# Patient Record
Sex: Female | Born: 2000 | Race: Black or African American | Hispanic: No | Marital: Single | Smoking: Never smoker
Health system: Southern US, Community
[De-identification: ages and names within clinical notes are randomized; demographics above are authoritative.]

---
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
## 2018-02-19 ENCOUNTER — Encounter (HOSPITAL_COMMUNITY): Payer: Self-pay | Admitting: Emergency Medicine

## 2018-02-19 ENCOUNTER — Emergency Department (HOSPITAL_COMMUNITY)
Admission: EM | Admit: 2018-02-19 | Discharge: 2018-02-19 | Disposition: A | Discharge status: Home / Self Care | Payer: Medicaid Other | Attending: Emergency Medicine | Admitting: Emergency Medicine

## 2018-02-19 DIAGNOSIS — R350 Frequency of micturition: Secondary | ICD-10-CM | POA: Insufficient documentation

## 2018-02-19 DIAGNOSIS — N898 Other specified noninflammatory disorders of vagina: Secondary | ICD-10-CM

## 2018-02-19 LAB — URINALYSIS, ROUTINE W REFLEX MICROSCOPIC
Bilirubin Urine: NEGATIVE
GLUCOSE, UA: NEGATIVE mg/dL
KETONES UR: NEGATIVE mg/dL
Nitrite: NEGATIVE
PROTEIN: NEGATIVE mg/dL
RBC / HPF: >50 RBC/hpf — ABNORMAL HIGH (ref 0–5)
Specific Gravity, Urine: 1.024 (ref 1.005–1.030)
pH: 5 (ref 5.0–8.0)

## 2018-02-19 LAB — WET PREP, GENITAL
Sperm: NONE SEEN
Trich, Wet Prep: NONE SEEN
Yeast Wet Prep HPF POC: NONE SEEN

## 2018-02-19 LAB — PREGNANCY, URINE: Preg Test, Ur: NEGATIVE

## 2018-02-19 MED ORDER — METRONIDAZOLE 500 MG PO TABS: 500.0000 mg | ORAL_TABLET | Freq: Two times a day (BID) | ORAL | 0 refills | Status: DC

## 2018-02-19 MED ORDER — CEFTRIAXONE SODIUM 250 MG IJ SOLR
250.0000 mg | Freq: Once | INTRAMUSCULAR | Status: AC
  Administered 2018-02-19: 250 mg via INTRAMUSCULAR
  Filled 2018-02-19: qty 250

## 2018-02-19 MED ORDER — DOXYCYCLINE HYCLATE 100 MG PO CAPS
100.0000 mg | ORAL_CAPSULE | Freq: Two times a day (BID) | ORAL | 0 refills | Status: DC
Start: 2018-02-19 — End: 2023-04-14

## 2018-02-19 MED ORDER — IBUPROFEN 800 MG PO TABS: 800.0000 mg | ORAL_TABLET | Freq: Three times a day (TID) | ORAL | 0 refills | Status: DC
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
## 2018-02-19 NOTE — ED Triage Notes (Signed)
Pt reports lower abd cramping and vaginal discharge that is white and sometimes has odor. Pt reports is told just BV and gets treated but feels couple be PID when Googled her s/s.

## 2018-02-19 NOTE — Discharge Instructions (Addendum)
You were seen in the ER for vaginal discharge, abdominal pain, and urinary symptoms. Your labs did not show signs of a UTI- we will call you if the culture returns with findings of UTI. We are waiting on your gonorrhea and chlamydia test results. As discussed we are covering your for PID with antibiotics: Doxycyline and Flagyl- take each of these antibiotics twice per day with food. Please take all of your antibiotics until finished. You may develop abdominal discomfort or diarrhea from the antibiotic.  You may help offset this with probiotics which you can buy at the store (ask your pharmacist if unable to find) or get probiotics in the form of eating yogurt. Do not eat or take the probiotics until 2 hours after your antibiotic. If you are unable to tolerate these side effects follow-up with your primary care provider or return to the emergency department.   If you begin to experience any blistering, rashes, swelling, or difficulty breathing seek medical care for evaluation of potentially more serious side effects.   Please be aware that this medication may interact with other medications you are taking, please be sure to discuss your medication list with your pharmacist. If you are taking birth control the antibiotic will deactivate your birth control for 2 weeks.   Additionally you may NOT drink alcohol when taking flagyl(metronidazole) as it can have serious life threatening consequences.   Abstain from intercourse of any kind until completion of your antibiotics.  We will call you if your gonorrhea/chlamydia results return positive, if positive you will need to inform all sexual partners that they may seek evaluation and treatment as necessary.  Follow-up with the women's health provider and your discharge instructions within 1 week.  Return to ER for new or worsening symptoms including but not limited to fever (temperature of 100.4 greater) inability to keep fluids down, worsening pain, or any other  concerns.  Additionally we have given you prescription for ibuprofen which may take for menstrual cramping.

## 2018-02-21 LAB — URINE CULTURE

## 2018-02-22 LAB — GC/CHLAMYDIA PROBE AMP (~~LOC~~) NOT AT ARMC
CHLAMYDIA, DNA PROBE: NEGATIVE
NEISSERIA GONORRHEA: NEGATIVE

## 2018-03-31 ENCOUNTER — Encounter

## 2018-10-11 ENCOUNTER — Emergency Department: Payer: No Typology Code available for payment source

## 2018-10-11 ENCOUNTER — Other Ambulatory Visit: Payer: Self-pay

## 2018-10-11 ENCOUNTER — Emergency Department
Admission: EM | Admit: 2018-10-11 | Discharge: 2018-10-11 | Disposition: A | Discharge status: Home / Self Care | Payer: No Typology Code available for payment source | Attending: Emergency Medicine | Admitting: Emergency Medicine

## 2018-10-11 DIAGNOSIS — Z79899 Other long term (current) drug therapy: Secondary | ICD-10-CM | POA: Diagnosis not present

## 2018-10-11 DIAGNOSIS — Y9389 Activity, other specified: Secondary | ICD-10-CM | POA: Diagnosis not present

## 2018-10-11 DIAGNOSIS — Y999 Unspecified external cause status: Secondary | ICD-10-CM | POA: Diagnosis not present

## 2018-10-11 DIAGNOSIS — R079 Chest pain, unspecified: Secondary | ICD-10-CM | POA: Insufficient documentation

## 2018-10-11 DIAGNOSIS — S3992XA Unspecified injury of lower back, initial encounter: Secondary | ICD-10-CM | POA: Diagnosis present

## 2018-10-11 DIAGNOSIS — Y92410 Unspecified street and highway as the place of occurrence of the external cause: Secondary | ICD-10-CM | POA: Diagnosis not present

## 2018-10-11 DIAGNOSIS — S8001XA Contusion of right knee, initial encounter: Secondary | ICD-10-CM | POA: Insufficient documentation

## 2018-10-11 DIAGNOSIS — S39012A Strain of muscle, fascia and tendon of lower back, initial encounter: Secondary | ICD-10-CM | POA: Insufficient documentation

## 2018-10-11 MED ORDER — IBUPROFEN 600 MG PO TABS
600.0000 mg | ORAL_TABLET | Freq: Once | ORAL | Status: AC
  Administered 2018-10-11: 600 mg via ORAL
  Filled 2018-10-11: qty 1

## 2018-10-11 MED ORDER — IBUPROFEN 600 MG PO TABS: 600.0000 mg | ORAL_TABLET | Freq: Four times a day (QID) | ORAL | 0 refills | Status: DC | PRN

## 2018-10-11 NOTE — ED Notes (Signed)
Pt in lobby, talking on phone in no acute distress.
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
## 2018-10-11 NOTE — ED Notes (Signed)
Pt is AOx4, VSS, she denies any pain. Pt is awaiting ride at this time.
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
## 2018-10-11 NOTE — ED Notes (Signed)
Warm blankets provided.

## 2018-10-11 NOTE — Discharge Instructions (Addendum)
It is normal for you to feel more sore later today or tomorrow.  Return to the ER for new or worsening pain, weakness, difficulty walking, or any other new or worsening symptoms that concern you.
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
## 2018-10-11 NOTE — ED Notes (Signed)
Pt to xray

## 2018-10-11 NOTE — ED Notes (Signed)
Pt is being discharged to home. Pt is AOx4, vss, she denies any pain at this time. AVS was given and explained to the patient and she verbalized understanding of all information. Pt is awaiting her ride at this time.
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
## 2021-05-16 ENCOUNTER — Ambulatory Visit: Payer: Self-pay

## 2021-05-17 ENCOUNTER — Ambulatory Visit
Admission: RE | Admit: 2021-05-17 | Discharge: 2021-05-17 | Disposition: A | Discharge status: Home / Self Care | Payer: BC Managed Care – PPO | Source: Ambulatory Visit | Attending: Emergency Medicine | Admitting: Emergency Medicine

## 2021-05-17 ENCOUNTER — Other Ambulatory Visit: Payer: Self-pay

## 2021-05-17 VITALS — BP 129/82 | HR 64 | Temp 99.1°F | Resp 18

## 2021-05-17 DIAGNOSIS — Z202 Contact with and (suspected) exposure to infections with a predominantly sexual mode of transmission: Secondary | ICD-10-CM | POA: Insufficient documentation

## 2021-05-17 LAB — POCT URINE PREGNANCY: Preg Test, Ur: NEGATIVE

## 2021-05-17 MED ORDER — CEFTRIAXONE SODIUM 500 MG IJ SOLR
500.0000 mg | Freq: Once | INTRAMUSCULAR | Status: AC
  Administered 2021-05-17: 500 mg via INTRAMUSCULAR

## 2021-05-17 NOTE — Discharge Instructions (Signed)
We gave you a shot of Rocephin to treat gonorrhea.  Please refrain from sexual intercourse over the next 2 weeks.  We are testing you for Gonorrhea, Chlamydia, Trichomonas, Yeast and Bacterial Vaginosis. We will call you if anything is positive and let you know if you require any further treatment. Please inform partners of any positive results.   Please return if symptoms not improving with treatment, development of fever, nausea, vomiting, abdominal pain.

## 2021-05-17 NOTE — ED Triage Notes (Signed)
Pt requesting to be tested for STDs d/t exposure (gonorrhea). Pt states she is having lower abd pain with discharge that started about 2-3 days ago.

## 2021-05-17 NOTE — ED Provider Notes (Signed)
[facility]    CSN: [id number] Arrival date & time: 05/17/21  1124      History   Chief Complaint Chief Complaint  Patient presents with   Appointment    1130   Exposure to STD    HPI Cassidy Brewer is a 20 y.o. female presenting today for STD screening after gonorrhea exposure.  Reports partner recently tested positive for gonorrhea.  Reports over the past 2 to 3 days has had lower abdominal discomfort along with vaginal discharge.  Recently with menstrual cycle earlier this month.  Denies fevers nausea or vomiting.  Denies urinary symptoms.  HPI  No past medical history on file.  There are no problems to display for this patient.   No past surgical history on file.  OB History   No obstetric history on file.      Home Medications    Prior to Admission medications   Medication Sig Start Date End Date Taking? Authorizing Provider  doxycycline (VIBRAMYCIN) 100 MG capsule Take 1 capsule (100 mg total) by mouth 2 (two) times daily. 02/19/18   Petrucelli, Pleas Koch, PA-C  etonogestrel (NEXPLANON) 68 MG IMPL implant Inject 68 mg into the skin daily.    [provider]  ibuprofen (ADVIL,MOTRIN) 600 MG tablet Take 1 tablet (600 mg total) by mouth every 6 (six) hours as needed. 10/11/18   Schaevitz, Myra Rude, MD  metroNIDAZOLE (FLAGYL) 500 MG tablet Take 1 tablet (500 mg total) by mouth 2 (two) times daily. 02/19/18   Petrucelli, Pleas Koch, PA-C    Family History No family history on file.  Social History Social History   Tobacco Use   Smoking status: Never   Smokeless tobacco: Never  Vaping Use   Vaping Use: Every day  Substance Use Topics   Alcohol use: Yes   Drug use: No     Allergies   Patient has no known allergies.   Review of Systems Review of Systems  Constitutional:  Negative for fever.  Respiratory:  Negative for shortness of breath.   Cardiovascular:  Negative for chest pain.  Gastrointestinal:  Positive for  abdominal pain. Negative for diarrhea, nausea and vomiting.  Genitourinary:  Positive for vaginal discharge. Negative for dysuria, flank pain, genital sores, hematuria, menstrual problem, vaginal bleeding and vaginal pain.  Musculoskeletal:  Negative for back pain.  Skin:  Negative for rash.  Neurological:  Negative for dizziness, light-headedness and headaches.    Physical Exam Triage Vital Signs ED Triage Vitals  Enc Vitals Group     BP 05/17/21 1202 129/82     Pulse Rate 05/17/21 1202 64     Resp 05/17/21 1202 18     Temp 05/17/21 1202 99.1 F (37.3 C)     Temp Source 05/17/21 1202 Oral     SpO2 05/17/21 1202 97 %     Weight --      Height --      Head Circumference --      Peak Flow --      Pain Score 05/17/21 1204 3     Pain Loc --      Pain Edu? --      Excl. in GC? --    No data found.  Updated Vital Signs BP 129/82 (BP Location: Right Arm)   Pulse 64   Temp 99.1 F (37.3 C) (Oral)   Resp 18   LMP 05/02/2021 (Approximate)   SpO2 97%   Visual Acuity Right Eye Distance:   Left  Eye Distance:   Bilateral Distance:    Right Eye Near:   Left Eye Near:    Bilateral Near:     Physical Exam Vitals and nursing note reviewed.  Constitutional:      Appearance: She is well-developed.     Comments: No acute distress  HENT:     Head: Normocephalic and atraumatic.     Nose: Nose normal.  Eyes:     Conjunctiva/sclera: Conjunctivae normal.  Cardiovascular:     Rate and Rhythm: Normal rate.  Pulmonary:     Effort: Pulmonary effort is normal. No respiratory distress.  Abdominal:     General: There is no distension.     Comments: Soft, nondistended, mild tenderness to palpation of bilateral lower quadrants, negative rebound, negative Rovsing, negative Burney's  Musculoskeletal:        General: Normal range of motion.     Cervical back: Neck supple.  Skin:    General: Skin is warm and dry.  Neurological:     Mental Status: She is alert and oriented to person,  place, and time.     UC Treatments / Results  Labs (all labs ordered are listed, but only abnormal results are displayed) Labs Reviewed  POCT URINE PREGNANCY  CERVICOVAGINAL ANCILLARY ONLY    EKG   Radiology No results found.  Procedures Procedures (including critical care time)  Medications Ordered in UC Medications  cefTRIAXone (ROCEPHIN) injection 500 mg (has no administration in time range)    Initial Impression / Assessment and Plan / UC Course  I have reviewed the triage vital signs and the nursing notes.  Pertinent labs & imaging results that were available during my care of the patient were reviewed by me and considered in my medical decision making (see chart for details).     STD screen/gonorrhea exposure-empirically treating with Rocephin to cover gonorrhea, vaginal swab pending to further screen for other STDs, will call with results and provide further treatment if needed.  Discussed strict return precautions. Patient verbalized understanding and is agreeable with plan.  Final Clinical Impressions(s) / UC Diagnoses   Final diagnoses:  STD exposure     Discharge Instructions      We gave you a shot of Rocephin to treat gonorrhea.  Please refrain from sexual intercourse over the next 2 weeks.  We are testing you for Gonorrhea, Chlamydia, Trichomonas, Yeast and Bacterial Vaginosis. We will call you if anything is positive and let you know if you require any further treatment. Please inform partners of any positive results.   Please return if symptoms not improving with treatment, development of fever, nausea, vomiting, abdominal pain.      ED Prescriptions   None    PDMP not reviewed this encounter.   Lew Dawes, PA-C 05/17/21 1240

## 2021-05-20 LAB — CERVICOVAGINAL ANCILLARY ONLY
Bacterial Vaginitis (gardnerella): POSITIVE — AB
Candida Glabrata: NEGATIVE
Candida Vaginitis: NEGATIVE
Chlamydia: NEGATIVE
Comment: NEGATIVE
Comment: NEGATIVE
Comment: NEGATIVE
Comment: NEGATIVE
Comment: NEGATIVE
Comment: NORMAL
Neisseria Gonorrhea: POSITIVE — AB
Trichomonas: NEGATIVE

## 2021-05-21 ENCOUNTER — Telehealth (HOSPITAL_COMMUNITY): Payer: Self-pay | Admitting: Emergency Medicine

## 2021-05-21 MED ORDER — METRONIDAZOLE 500 MG PO TABS: 500.0000 mg | ORAL_TABLET | Freq: Two times a day (BID) | ORAL | 0 refills | Status: DC

## 2023-03-12 ENCOUNTER — Other Ambulatory Visit: Payer: Self-pay

## 2023-03-12 ENCOUNTER — Emergency Department (HOSPITAL_COMMUNITY)
Admission: EM | Admit: 2023-03-12 | Discharge: 2023-03-12 | Disposition: A | Discharge status: Home / Self Care | Payer: Medicaid Other | Attending: Emergency Medicine | Admitting: Emergency Medicine

## 2023-03-12 ENCOUNTER — Encounter (HOSPITAL_COMMUNITY): Payer: Self-pay | Admitting: Emergency Medicine

## 2023-03-12 DIAGNOSIS — L0501 Pilonidal cyst with abscess: Secondary | ICD-10-CM | POA: Diagnosis present

## 2023-03-12 MED ORDER — LIDOCAINE-EPINEPHRINE-TETRACAINE (LET) TOPICAL GEL
3.0000 mL | Freq: Once | TOPICAL | Status: AC
  Administered 2023-03-12: 3 mL via TOPICAL
  Filled 2023-03-12: qty 3

## 2023-03-12 MED ORDER — LIDOCAINE-EPINEPHRINE (PF) 2 %-1:200000 IJ SOLN
10.0000 mL | Freq: Once | INTRAMUSCULAR | Status: AC
  Administered 2023-03-12: 10 mL
  Filled 2023-03-12: qty 20

## 2023-03-12 NOTE — Discharge Instructions (Signed)
It was a pleasure taking care of you here in the emergency department today you had your abscess drained.  You have a drain in place.  This will need to be in place for around 48 hours.  Warm compress to area.  We want this area to continue draining.  Continue taking the antibiotics provided you by urgent care.  May also take Tylenol and ibuprofen.  If area worsens, you develop fever after 24 hours on antibiotics, please seek reevaluation

## 2023-03-12 NOTE — ED Triage Notes (Signed)
Pt reports cyst to her buttocks for a week. Sent from [facility] for I&D. Given abx prescription at [facility].

## 2023-03-12 NOTE — ED Provider Notes (Signed)
[place] EMERGENCY DEPARTMENT AT [facility] Provider Note   CSN: [id number] Arrival date & time: 03/12/23  1241    History  Chief Complaint  Patient presents with   Abscess    Cassidy Brewer is a 22 y.o. female past medical history here for evaluation of lesion to buttocks. Started 1 week ago, seen by urgent care 2 days ago.  Had some induration however no fluctuance per their note therefore did not have I&D performed.  She was started on Bactrim on Tuesday.  Came here today for worsening area. No hx of similar.    Tetanus within 5 years   No fever, nausea, vomiting, abdominal pain, bloody stool, pain with bowel movements.  HPI     Home Medications Prior to Admission medications   Medication Sig Start Date End Date Taking? Authorizing Provider  doxycycline (VIBRAMYCIN) 100 MG capsule Take 1 capsule (100 mg total) by mouth 2 (two) times daily. 02/19/18   Petrucelli, Pleas Koch, PA-C  etonogestrel (NEXPLANON) 68 MG IMPL implant Inject 68 mg into the skin daily.    [provider]  ibuprofen (ADVIL,MOTRIN) 600 MG tablet Take 1 tablet (600 mg total) by mouth every 6 (six) hours as needed. 10/11/18   Schaevitz, Myra Rude, MD  metroNIDAZOLE (FLAGYL) 500 MG tablet Take 1 tablet (500 mg total) by mouth 2 (two) times daily. 05/21/21   Lamptey, Britta Mccreedy, MD      Allergies    Patient has no known allergies.    Review of Systems   Review of Systems  Constitutional: Negative.   HENT: Negative.    Respiratory: Negative.    Cardiovascular: Negative.   Gastrointestinal: Negative.   Genitourinary: Negative.   Musculoskeletal: Negative.   Skin:  Positive for wound.  Neurological: Negative.   All other systems reviewed and are negative.   Physical Exam Updated Vital Signs BP 111/85 (BP Location: Left Arm)   Pulse 93   Temp 98.3 F (36.8 C) (Oral)   Resp 18   Ht 5\' 7"  (1.702 m)   Wt 75.3 kg   LMP 02/27/2023   SpO2 97%   BMI 26.00 kg/m  Physical  Exam Vitals and nursing note reviewed. Exam conducted with a chaperone present.  Constitutional:      General: She is not in acute distress.    Appearance: She is well-developed. She is not ill-appearing.  HENT:     Head: Atraumatic.  Eyes:     Pupils: Pupils are equal, round, and reactive to light.  Cardiovascular:     Rate and Rhythm: Normal rate.  Pulmonary:     Effort: No respiratory distress.  Abdominal:     General: There is no distension.  Genitourinary:    Comments: Fluctuance induration to left gluteal crease, does not extend into perineal, rectal region Musculoskeletal:        General: Normal range of motion.     Cervical back: Normal range of motion.       Back:  Skin:    General: Skin is warm and dry.     Capillary Refill: Capillary refill takes less than 2 seconds.     Comments: No erythema, warmth  Neurological:     General: No focal deficit present.     Mental Status: She is alert.  Psychiatric:        Mood and Affect: Mood normal.    ED Results / Procedures / Treatments   Labs (all labs ordered are listed, but only abnormal results  are displayed) Labs Reviewed - No data to display  EKG None  Radiology No results found.  Procedures .Marland KitchenIncision and Drainage  Date/Time: 03/12/2023 2:15 PM  Performed by: Linwood Dibbles, PA-C Authorized by: Linwood Dibbles, PA-C   Consent:    Consent obtained:  Verbal   Consent given by:  Patient   Risks, benefits, and alternatives were discussed: yes     Risks discussed:  Bleeding, pain, damage to other organs, infection and incomplete drainage   Alternatives discussed:  No treatment, delayed treatment, alternative treatment, observation and referral Universal protocol:    Procedure explained and questions answered to patient or proxy's satisfaction: yes     Relevant documents present and verified: yes     Test results available : yes     Imaging studies available: yes     Required blood products,  implants, devices, and special equipment available: yes     Site/side marked: yes     Immediately prior to procedure, a time out was called: yes     Patient identity confirmed:  Verbally with patient Location:    Type:  Pilonidal cyst   Size:  3   Location:  Anogenital   Anogenital location:  Pilonidal Pre-procedure details:    Skin preparation:  Povidone-iodine Sedation:    Sedation type:  None Anesthesia:    Anesthesia method:  Topical application and local infiltration   Topical anesthetic:  LET   Local anesthetic:  Lidocaine 1% WITH epi Procedure type:    Complexity:  Complex Procedure details:    Ultrasound guidance: yes     Needle aspiration: no     Incision types:  Stab incision   Wound management:  Probed and deloculated, irrigated with saline and extensive cleaning   Drainage:  Purulent   Drainage amount:  Moderate   Wound treatment:  Drain placed   Packing materials:  1/4 in gauze Post-procedure details:    Procedure completion:  Tolerated well, no immediate complications Ultrasound ED Soft Tissue  Date/Time: 03/12/2023 2:19 PM  Performed by: Ralph Leyden A, PA-C Authorized by: Linwood Dibbles, PA-C   Procedure details:    Indications: localization of abscess and evaluate for cellulitis     Transverse view:  Visualized   Longitudinal view:  Visualized   Images: archived     Limitations:  Patient compliance Location:    Location: buttocks     Side:  Left Findings:     abscess present    cellulitis present    no foreign body present     Medications Ordered in ED Medications  lidocaine-EPINEPHrine (XYLOCAINE W/EPI) 2 %-1:200000 (PF) injection 10 mL (10 mLs Infiltration Given by Other 03/12/23 1422)  lidocaine-EPINEPHrine-tetracaine (LET) topical gel (3 mLs Topical Given 03/12/23 1336)    ED Course/ Medical Decision Making/ A&P   22 year old here for evaluation of gluteal abscess.  She is afebrile, nonseptic, not ill-appearing.  Apparently seen by  urgent care 2 days ago, I reviewed their note.  According to them there is some induration however no fluctuance therefore did not have I&D performed.  Was started on Bactrim.  Area subsequently worsened, came here for evaluation.  Denies chance of pregnancy  On exam no fluctuance induration to left gluteal crease at natal cleft.  Side ultrasound performed which showed abscess and cellulitis.  Attempt bedside I&D.  See procedure note.  Patient tolerated well.  Packing placed.  Encouraged warm soaks, continued drainage, continue antibiotics provided by urgent care.  She will return  in 2 days for wound recheck and reevaluation.  Return sooner symptoms worsen.  At this time I have low suspicion for necrotizing fasciitis, Fournier's gangrene, rectal abscess, deep space infection, traumatic injury.  Do not feel she needs labs and imaging at this time   The patient has been appropriately medically screened and/or stabilized in the ED. I have low suspicion for any other emergent medical condition which would require further screening, evaluation or treatment in the ED or require inpatient management.  Patient is hemodynamically stable and in no acute distress.  Patient able to ambulate in department prior to ED.  Evaluation does not show acute pathology that would require ongoing or additional emergent interventions while in the emergency department or further inpatient treatment.  I have discussed the diagnosis with the patient and answered all questions.  Pain is been managed while in the emergency department and patient has no further complaints prior to discharge.  Patient is comfortable with plan discussed in room and is stable for discharge at this time.  I have discussed strict return precautions for returning to the emergency department.  Patient was encouraged to follow-up with PCP/specialist refer to at discharge.                             Medical Decision Making Amount and/or Complexity of Data  Reviewed External Data Reviewed: labs, radiology and notes.  Risk OTC drugs. Prescription drug management. Diagnosis or treatment significantly limited by social determinants of health.          Final Clinical Impression(s) / ED Diagnoses Final diagnoses:  Pilonidal abscess    Rx / DC Orders ED Discharge Orders     None         Torianne Laflam A, PA-C 03/12/23 1431    Pricilla Loveless, MD 03/12/23 1617

## 2023-03-14 ENCOUNTER — Emergency Department (HOSPITAL_COMMUNITY)
Admission: EM | Admit: 2023-03-14 | Discharge: 2023-03-14 | Disposition: A | Discharge status: Home / Self Care | Payer: Medicaid Other | Attending: Emergency Medicine | Admitting: Emergency Medicine

## 2023-03-14 ENCOUNTER — Other Ambulatory Visit: Payer: Self-pay

## 2023-03-14 ENCOUNTER — Encounter (HOSPITAL_COMMUNITY): Payer: Self-pay

## 2023-03-14 DIAGNOSIS — Z5189 Encounter for other specified aftercare: Secondary | ICD-10-CM | POA: Diagnosis present

## 2023-03-14 DIAGNOSIS — L0591 Pilonidal cyst without abscess: Secondary | ICD-10-CM | POA: Insufficient documentation

## 2023-03-14 NOTE — Discharge Instructions (Signed)
It was a pleasure taking care of you!  Your packing was removed today.  Continue taking the Bactrim as prescribed.  Is important that you call your primary care provider regarding today's ED visit.  Return to the emergency department if you are experiencing increasing/worsening symptoms.

## 2023-03-14 NOTE — ED Triage Notes (Signed)
Patient had packing in her buttocks wound 2 days ago and said she was told to come get it removed.

## 2023-03-14 NOTE — ED Provider Notes (Signed)
[place] EMERGENCY DEPARTMENT AT [facility] Provider Note   CSN: [id number] Arrival date & time: 03/14/23  1725     History  Chief Complaint  Patient presents with   Wound Check    Cassidy Brewer is a 22 y.o. female who presents to the emergency department with with concerns for wound check.  Patient was evaluated emergency department on 03/12/2023 for pilonidal cyst and had packing placed to the area.  Patient has been placed on Bactrim and she has been compliant with the medication.  Denies any fever.  The history is provided by the patient. No language interpreter was used.       Home Medications Prior to Admission medications   Medication Sig Start Date End Date Taking? Authorizing Provider  doxycycline (VIBRAMYCIN) 100 MG capsule Take 1 capsule (100 mg total) by mouth 2 (two) times daily. 02/19/18   Petrucelli, Pleas Koch, PA-C  etonogestrel (NEXPLANON) 68 MG IMPL implant Inject 68 mg into the skin daily.    [provider]  ibuprofen (ADVIL,MOTRIN) 600 MG tablet Take 1 tablet (600 mg total) by mouth every 6 (six) hours as needed. 10/11/18   Schaevitz, Myra Rude, MD  metroNIDAZOLE (FLAGYL) 500 MG tablet Take 1 tablet (500 mg total) by mouth 2 (two) times daily. 05/21/21   Lamptey, Britta Mccreedy, MD      Allergies    Patient has no known allergies.    Review of Systems   Review of Systems  All other systems reviewed and are negative.   Physical Exam Updated Vital Signs BP 123/82   Pulse 96   Temp 98.2 F (36.8 C) (Oral)   Resp 16   Ht 5\' 7"  (1.702 m)   Wt 75 kg   LMP 02/27/2023   SpO2 100%   BMI 25.90 kg/m  Physical Exam Vitals and nursing note reviewed.  Constitutional:      General: She is not in acute distress.    Appearance: Normal appearance.  Eyes:     General: No scleral icterus.    Extraocular Movements: Extraocular movements intact.  Cardiovascular:     Rate and Rhythm: Normal rate.  Pulmonary:     Effort: Pulmonary  effort is normal. No respiratory distress.  Abdominal:     Palpations: Abdomen is soft. There is no mass.     Tenderness: There is no abdominal tenderness.  Musculoskeletal:        General: Normal range of motion.     Cervical back: Neck supple.  Skin:    General: Skin is warm and dry.     Findings: No rash.     Comments: Packing in place to left upper gluteus.  No surrounding erythema noted.  No appreciable drainage noted.  Neurological:     Mental Status: She is alert.     Sensory: Sensation is intact.     Motor: Motor function is intact.  Psychiatric:        Behavior: Behavior normal.     ED Results / Procedures / Treatments   Labs (all labs ordered are listed, but only abnormal results are displayed) Labs Reviewed - No data to display  EKG None  Radiology No results found.  Procedures Procedures    Medications Ordered in ED Medications - No data to display  ED Course/ Medical Decision Making/ A&P  Medical Decision Making  Pt presents with concerns for wound check. Vital signs, patient afebrile. On exam, pt with packing in place to left upper gluteus.  No surrounding erythema noted.  No appreciable drainage noted. Differential diagnosis includes cellulitis, abscess.    Additional history obtained:  External records from outside source obtained and reviewed including: Patient was evaluated in the emergency department on 03/12/2023 and diagnosed with a pilonidal cyst that was drained and packed in the emergency department.  Patient has been on Bactrim since 03/12/2023.  Disposition: Presenting suspicious for wound check.  No concerns at this time for cellulitis.  Packing removed by myself in the emergency department. After consideration of the diagnostic results and the patients response to treatment, I feel that the patient would benefit from Discharge home.  Patient instructed on wound care as well as importance of following up with her  primary care provider regarding today's ED visit.  Supportive care measures and strict return precautions discussed with patient at bedside. Pt acknowledges and verbalizes understanding. Pt appears safe for discharge. Follow up as indicated in discharge paperwork.    This chart was dictated using voice recognition software, Dragon. Despite the best efforts of this provider to proofread and correct errors, errors may still occur which can change documentation meaning.  Final Clinical Impression(s) / ED Diagnoses Final diagnoses:  Visit for wound check    Rx / DC Orders ED Discharge Orders     None         Erwin Nishiyama A, PA-C 03/14/23 1817    Laurence Spates, MD 03/14/23 [phone]

## 2023-04-13 ENCOUNTER — Inpatient Hospital Stay: Admission: RE | Admit: 2023-04-13 | Payer: Medicaid Other | Source: Ambulatory Visit

## 2023-04-14 ENCOUNTER — Inpatient Hospital Stay: Admission: RE | Admit: 2023-04-14 | Payer: Medicaid Other | Source: Ambulatory Visit

## 2023-04-14 ENCOUNTER — Encounter: Payer: Self-pay | Admitting: *Deleted

## 2023-04-14 ENCOUNTER — Ambulatory Visit
Admission: EM | Admit: 2023-04-14 | Discharge: 2023-04-14 | Disposition: A | Discharge status: Home / Self Care | Payer: Medicaid Other | Attending: Family Medicine | Admitting: Family Medicine

## 2023-04-14 DIAGNOSIS — Z113 Encounter for screening for infections with a predominantly sexual mode of transmission: Secondary | ICD-10-CM | POA: Diagnosis not present

## 2023-04-14 DIAGNOSIS — R109 Unspecified abdominal pain: Secondary | ICD-10-CM | POA: Diagnosis present

## 2023-04-14 DIAGNOSIS — N898 Other specified noninflammatory disorders of vagina: Secondary | ICD-10-CM

## 2023-04-14 LAB — POCT URINALYSIS DIP (MANUAL ENTRY)
Bilirubin, UA: NEGATIVE
Blood, UA: NEGATIVE
Glucose, UA: NEGATIVE mg/dL
Ketones, POC UA: NEGATIVE mg/dL
Leukocytes, UA: NEGATIVE
Nitrite, UA: NEGATIVE
Protein Ur, POC: NEGATIVE mg/dL
Spec Grav, UA: >=1.03 — AB (ref 1.010–1.025)
Urobilinogen, UA: 0.2 E.U./dL
pH, UA: 5.5 (ref 5.0–8.0)

## 2023-04-14 LAB — POCT URINE PREGNANCY: Preg Test, Ur: NEGATIVE

## 2023-04-14 NOTE — Discharge Instructions (Signed)
The urine tests are normal/negative The vaginal swab result will be available in 1-2 days You can check it on my chart A nurse will call you if anything is positive

## 2023-04-14 NOTE — ED Triage Notes (Signed)
C/O vaginal discharge onset approx 1 wk ago with slight low abd pain. Denies fevers.

## 2023-04-14 NOTE — ED Provider Notes (Signed)
[facility]    CSN: [id number] Arrival date & time: 04/14/23  1534      History   Chief Complaint Chief Complaint  Patient presents with   Vaginal Discharge   Abdominal Pain    HPI Cassidy Brewer is a 22 y.o. female.   HPI Patient desires STD testing She states she is using condoms for birth control.  In spite of that she has a vaginal discharge and is concerned for infection She feels certain that she is not pregnant No urinary symptoms She states she has some crampy abdominal pain right before her period started.  Last menstrual period 03/28/2023 No fever chills, nausea vomiting  History reviewed. No pertinent past medical history.  There are no problems to display for this patient.   History reviewed. No pertinent surgical history.  OB History   No obstetric history on file.      Home Medications    Prior to Admission medications   Medication Sig Start Date End Date Taking? Authorizing Provider  etonogestrel (NEXPLANON) 68 MG IMPL implant Inject 68 mg into the skin daily.    [provider]    Family History History reviewed. No pertinent family history.  Social History Social History   Tobacco Use   Smoking status: Never   Smokeless tobacco: Never  Vaping Use   Vaping status: Every Day  Substance Use Topics   Alcohol use: Yes    Comment: occasionally   Drug use: No     Allergies   Patient has no known allergies.   Review of Systems Review of Systems See HPI  Physical Exam Triage Vital Signs ED Triage Vitals  Encounter Vitals Group     BP 04/14/23 1538 128/87     Systolic BP Percentile --      Diastolic BP Percentile --      Pulse Rate 04/14/23 1538 64     Resp 04/14/23 1538 16     Temp 04/14/23 1538 98.5 F (36.9 C)     Temp Source 04/14/23 1538 Oral     SpO2 04/14/23 1538 100 %     Weight --      Height --      Head Circumference --      Peak Flow --      Pain Score 04/14/23 1539 1     Pain Loc --       Pain Education --      Exclude from Growth Chart --    No data found.  Updated Vital Signs BP 128/87   Pulse 64   Temp 98.5 F (36.9 C) (Oral)   Resp 16   LMP 03/28/2023 (Exact Date)   SpO2 100%       Physical Exam Constitutional:      General: She is not in acute distress.    Appearance: She is well-developed.  HENT:     Head: Normocephalic and atraumatic.  Eyes:     Conjunctiva/sclera: Conjunctivae normal.     Pupils: Pupils are equal, round, and reactive to light.  Cardiovascular:     Rate and Rhythm: Normal rate.  Pulmonary:     Effort: Pulmonary effort is normal. No respiratory distress.  Abdominal:     General: Bowel sounds are normal. There is no distension.     Palpations: Abdomen is soft.     Tenderness: There is no abdominal tenderness.  Musculoskeletal:        General: Normal range of motion.  Cervical back: Normal range of motion.  Skin:    General: Skin is warm and dry.  Neurological:     Mental Status: She is alert.      UC Treatments / Results  Labs (all labs ordered are listed, but only abnormal results are displayed) Labs Reviewed  POCT URINALYSIS DIP (MANUAL ENTRY) - Abnormal; Notable for the following components:      Result Value   Spec Grav, UA >=1.030 (*)    All other components within normal limits  POCT URINE PREGNANCY  CERVICOVAGINAL ANCILLARY ONLY    EKG   Radiology No results found.  Procedures Procedures (including critical care time)  Medications Ordered in UC Medications - No data to display  Initial Impression / Assessment and Plan / UC Course  I have reviewed the triage vital signs and the nursing notes.  Pertinent labs & imaging results that were available during my care of the patient were reviewed by me and considered in my medical decision making (see chart for details).     Final Clinical Impressions(s) / UC Diagnoses   Final diagnoses:  Vaginal discharge     Discharge Instructions       The urine tests are normal/negative The vaginal swab result will be available in 1-2 days You can check it on my chart A nurse will call you if anything is positive     ED Prescriptions   None    PDMP not reviewed this encounter.   Eustace Moore, MD 04/14/23 [phone]

## 2023-04-15 ENCOUNTER — Telehealth (HOSPITAL_COMMUNITY): Payer: Self-pay | Admitting: Emergency Medicine

## 2023-04-15 MED ORDER — FLUCONAZOLE 150 MG PO TABS: 150.0000 mg | ORAL_TABLET | Freq: Once | ORAL | 0 refills | Status: AC

## 2023-04-15 MED ORDER — METRONIDAZOLE 500 MG PO TABS: 500.0000 mg | ORAL_TABLET | Freq: Two times a day (BID) | ORAL | 0 refills | Status: DC

## 2023-08-05 ENCOUNTER — Ambulatory Visit: Payer: Medicaid Other

## 2023-09-29 ENCOUNTER — Ambulatory Visit: Payer: Medicaid Other

## 2023-10-28 ENCOUNTER — Ambulatory Visit
Admission: RE | Admit: 2023-10-28 | Discharge: 2023-10-28 | Disposition: A | Discharge status: Home / Self Care | Payer: Medicaid Other | Source: Ambulatory Visit | Attending: Family Medicine | Admitting: Family Medicine

## 2023-10-28 VITALS — BP 116/80 | HR 53 | Temp 98.0°F | Resp 16

## 2023-10-28 DIAGNOSIS — Z113 Encounter for screening for infections with a predominantly sexual mode of transmission: Secondary | ICD-10-CM | POA: Insufficient documentation

## 2023-10-28 DIAGNOSIS — N760 Acute vaginitis: Secondary | ICD-10-CM | POA: Diagnosis present

## 2023-10-28 DIAGNOSIS — F17290 Nicotine dependence, other tobacco product, uncomplicated: Secondary | ICD-10-CM | POA: Insufficient documentation

## 2023-10-28 DIAGNOSIS — N898 Other specified noninflammatory disorders of vagina: Secondary | ICD-10-CM | POA: Insufficient documentation

## 2023-10-28 DIAGNOSIS — Z202 Contact with and (suspected) exposure to infections with a predominantly sexual mode of transmission: Secondary | ICD-10-CM | POA: Diagnosis not present

## 2023-10-28 LAB — CERVICOVAGINAL ANCILLARY ONLY
Bacterial Vaginitis (gardnerella): POSITIVE — AB
Candida Glabrata: NEGATIVE
Candida Vaginitis: NEGATIVE
Chlamydia: NEGATIVE
Comment: NEGATIVE
Comment: NEGATIVE
Comment: NEGATIVE
Comment: NEGATIVE
Comment: NEGATIVE
Comment: NORMAL
Neisseria Gonorrhea: NEGATIVE
Trichomonas: NEGATIVE

## 2023-10-28 MED ORDER — METRONIDAZOLE 500 MG PO TABS: 500.0000 mg | ORAL_TABLET | Freq: Two times a day (BID) | ORAL | 0 refills | Status: DC

## 2023-10-28 NOTE — ED Provider Notes (Signed)
 [facility]    CSN: [id number] Arrival date & time: 10/28/23  0865      History   Chief Complaint Chief Complaint  Patient presents with   Vaginal Discharge    HPI Cassidy Brewer is a 23 y.o. female.   HPI pleasant 23 year old female presents with vaginal discharge patient reports history of bacterial vaginosis.  Patient request STD testing as well today.  History reviewed. No pertinent past medical history.  There are no active problems to display for this patient.   History reviewed. No pertinent surgical history.  OB History   No obstetric history on file.      Home Medications    Prior to Admission medications   Medication Sig Start Date End Date Taking? Authorizing Provider  metroNIDAZOLE (FLAGYL) 500 MG tablet Take 1 tablet (500 mg total) by mouth 2 (two) times daily. 10/28/23  Yes Trevor Iha, FNP  etonogestrel (NEXPLANON) 68 MG IMPL implant Inject 68 mg into the skin daily.    [provider]    Family History History reviewed. No pertinent family history.  Social History Social History   Tobacco Use   Smoking status: Never   Smokeless tobacco: Never  Vaping Use   Vaping status: Every Day   Substances: Nicotine, Flavoring  Substance Use Topics   Alcohol use: Yes    Comment: occasionally   Drug use: No     Allergies   Patient has no known allergies.   Review of Systems Review of Systems  Genitourinary:  Positive for vaginal discharge.  All other systems reviewed and are negative.    Physical Exam Triage Vital Signs ED Triage Vitals  Encounter Vitals Group     BP      Systolic BP Percentile      Diastolic BP Percentile      Pulse      Resp      Temp      Temp src      SpO2      Weight      Height      Head Circumference      Peak Flow      Pain Score      Pain Loc      Pain Education      Exclude from Growth Chart    No data found.  Updated Vital Signs BP 116/80   Pulse (!) 53   Temp 98  F (36.7 C)   Resp 16   LMP 10/17/2023   SpO2 98%    Physical Exam Vitals and nursing note reviewed.  Constitutional:      General: She is not in acute distress.    Appearance: Normal appearance. She is obese. She is not ill-appearing.  HENT:     Head: Normocephalic and atraumatic.     Mouth/Throat:     Mouth: Mucous membranes are moist.     Pharynx: Oropharynx is clear.  Eyes:     Extraocular Movements: Extraocular movements intact.     Conjunctiva/sclera: Conjunctivae normal.     Pupils: Pupils are equal, round, and reactive to light.  Cardiovascular:     Rate and Rhythm: Normal rate and regular rhythm.     Pulses: Normal pulses.     Heart sounds: Normal heart sounds.  Pulmonary:     Effort: Pulmonary effort is normal.     Breath sounds: Normal breath sounds. No wheezing, rhonchi or rales.  Musculoskeletal:        General: Normal  range of motion.     Cervical back: Normal range of motion and neck supple.  Skin:    General: Skin is warm and dry.  Neurological:     General: No focal deficit present.     Mental Status: She is alert and oriented to person, place, and time. Mental status is at baseline.  Psychiatric:        Mood and Affect: Mood normal.        Behavior: Behavior normal.      UC Treatments / Results  Labs (all labs ordered are listed, but only abnormal results are displayed) Labs Reviewed  CERVICOVAGINAL ANCILLARY ONLY    EKG   Radiology No results found.  Procedures Procedures (including critical care time)  Medications Ordered in UC Medications - No data to display  Initial Impression / Assessment and Plan / UC Course  I have reviewed the triage vital signs and the nursing notes.  Pertinent labs & imaging results that were available during my care of the patient were reviewed by me and considered in my medical decision making (see chart for details).     MDM: 1.  Vaginal discharge-Rx'd Flagyl 500 mg tablet: Take 1 tablet twice daily  x 7 days; 2.  Potential exposure to STD-Aptima swab ordered (GC chlamydia and trichomonas). Advised patient take medication as directed with food to completion.  Encouraged to increase daily water intake to 64 ounces per day while taking this medication.  Advised we will follow-up with Aptima swab results once received.  Advised if symptoms worsen and/or unresolved please follow-up with your GYN, PCP, or here for further evaluation.  Work note provided to patient prior to discharge per patient request. Final Clinical Impressions(s) / UC Diagnoses   Final diagnoses:  Potential exposure to STD  Vaginal discharge     Discharge Instructions      Advised patient take medication as directed with food to completion.  Encouraged to increase daily water intake to 64 ounces per day while taking this medication.  Advised we will follow-up with Aptima swab results once received.  Advised if symptoms worsen and/or unresolved please follow-up with your GYN, PCP, or here for further evaluation.     ED Prescriptions     Medication Sig Dispense Auth. Provider   metroNIDAZOLE (FLAGYL) 500 MG tablet Take 1 tablet (500 mg total) by mouth 2 (two) times daily. 14 tablet Trevor Iha, FNP      PDMP not reviewed this encounter.   Trevor Iha, FNP 10/28/23 [phone]

## 2023-10-28 NOTE — Discharge Instructions (Addendum)
 Advised patient take medication as directed with food to completion.  Encouraged to increase daily water intake to 64 ounces per day while taking this medication.  Advised we will follow-up with Aptima swab results once received.  Advised if symptoms worsen and/or unresolved please follow-up with your GYN, PCP, or here for further evaluation.

## 2023-10-28 NOTE — ED Triage Notes (Signed)
 Pt presents with need for sti testing she is concerned for BV pt reports vaginal discharge for a week. With ph of bv.

## 2023-12-17 ENCOUNTER — Ambulatory Visit

## 2023-12-17 ENCOUNTER — Ambulatory Visit
Admission: RE | Admit: 2023-12-17 | Discharge: 2023-12-17 | Disposition: A | Discharge status: Home / Self Care | Source: Ambulatory Visit | Attending: Family Medicine | Admitting: Family Medicine

## 2023-12-17 ENCOUNTER — Other Ambulatory Visit: Payer: Self-pay

## 2023-12-17 VITALS — BP 119/80 | HR 87 | Temp 98.5°F | Resp 16

## 2023-12-17 DIAGNOSIS — Z9189 Other specified personal risk factors, not elsewhere classified: Secondary | ICD-10-CM

## 2023-12-17 NOTE — ED Triage Notes (Signed)
 Wants std testing, swab only. No symptoms.

## 2023-12-17 NOTE — ED Provider Notes (Signed)
 [facility]    CSN: [id number] Arrival date & time: 12/17/23  1356      History   Chief Complaint Chief Complaint  Patient presents with   SEXUALLY TRANSMITTED DISEASE    HPI Waneta Fitting is a 23 y.o. female.   HPI  Without getting a lot of information patient is requesting testing for sexually transmitted disease.  She states she is asymptomatic.  She states she has had no known exposure.  She states that she has had unprotected sexual relations  History reviewed. No pertinent past medical history.  There are no active problems to display for this patient.   History reviewed. No pertinent surgical history.  OB History   No obstetric history on file.      Home Medications    Prior to Admission medications   Not on File    Family History History reviewed. No pertinent family history.  Social History Social History   Tobacco Use   Smoking status: Never   Smokeless tobacco: Never  Vaping Use   Vaping status: Every Day   Substances: Nicotine, Flavoring  Substance Use Topics   Alcohol use: Yes    Comment: occasionally   Drug use: No     Allergies   Patient has no known allergies.   Review of Systems Review of Systems  See HPI Physical Exam Triage Vital Signs ED Triage Vitals  Encounter Vitals Group     BP 12/17/23 1401 119/80     Systolic BP Percentile --      Diastolic BP Percentile --      Pulse Rate 12/17/23 1401 87     Resp 12/17/23 1401 16     Temp 12/17/23 1401 98.5 F (36.9 C)     Temp src --      SpO2 12/17/23 1401 96 %     Weight --      Height --      Head Circumference --      Peak Flow --      Pain Score 12/17/23 1404 0     Pain Loc --      Pain Education --      Exclude from Growth Chart --    No data found.  Updated Vital Signs BP 119/80   Pulse 87   Temp 98.5 F (36.9 C)   Resp 16   LMP 12/09/2023 (Approximate)   SpO2 96%        Physical Exam Constitutional:      General: She is not in  acute distress.    Appearance: She is well-developed.  HENT:     Head: Normocephalic and atraumatic.  Eyes:     Conjunctiva/sclera: Conjunctivae normal.     Pupils: Pupils are equal, round, and reactive to light.  Cardiovascular:     Rate and Rhythm: Normal rate.  Pulmonary:     Effort: Pulmonary effort is normal. No respiratory distress.  Musculoskeletal:        General: Normal range of motion.     Cervical back: Normal range of motion.  Skin:    General: Skin is warm and dry.  Neurological:     Mental Status: She is alert.      UC Treatments / Results  Labs (all labs ordered are listed, but only abnormal results are displayed) Labs Reviewed  CERVICOVAGINAL ANCILLARY ONLY    EKG   Radiology No results found.  Procedures Procedures (including critical care time)  Medications Ordered in UC Medications -  No data to display  Initial Impression / Assessment and Plan / UC Course  I have reviewed the triage vital signs and the nursing notes.  Pertinent labs & imaging results that were available during my care of the patient were reviewed by me and considered in my medical decision making (see chart for details).     Patient does have a history of BV and yeast infections.  She knows how to check MyChart for test results. Final Clinical Impressions(s) / UC Diagnoses   Final diagnoses:  At risk for sexually transmitted disease due to unprotected sex     Discharge Instructions      Check MyChart for your test results You will be called if any other results are positive Avoid sexual contact until results are known and treated   ED Prescriptions   None    PDMP not reviewed this encounter.   Stephany Ehrich, MD 12/17/23 [phone]

## 2023-12-17 NOTE — Discharge Instructions (Signed)
 Check MyChart for your test results You will be called if any other results are positive Avoid sexual contact until results are known and treated

## 2023-12-18 ENCOUNTER — Telehealth (HOSPITAL_COMMUNITY): Payer: Self-pay

## 2023-12-18 LAB — CERVICOVAGINAL ANCILLARY ONLY
Bacterial Vaginitis (gardnerella): POSITIVE — AB
Candida Glabrata: NEGATIVE
Candida Vaginitis: POSITIVE — AB
Chlamydia: NEGATIVE
Comment: NEGATIVE
Comment: NEGATIVE
Comment: NEGATIVE
Comment: NEGATIVE
Comment: NEGATIVE
Comment: NORMAL
Neisseria Gonorrhea: NEGATIVE
Trichomonas: NEGATIVE

## 2023-12-18 MED ORDER — FLUCONAZOLE 150 MG PO TABS: 150.0000 mg | ORAL_TABLET | Freq: Once | ORAL | 0 refills | Status: AC

## 2024-03-28 ENCOUNTER — Other Ambulatory Visit: Payer: Self-pay

## 2024-03-28 ENCOUNTER — Ambulatory Visit
Admission: RE | Admit: 2024-03-28 | Discharge: 2024-03-28 | Disposition: A | Discharge status: Home / Self Care | Source: Ambulatory Visit | Attending: Family Medicine | Admitting: Family Medicine

## 2024-03-28 VITALS — BP 131/78 | HR 77 | Temp 99.2°F | Resp 16

## 2024-03-28 DIAGNOSIS — Z113 Encounter for screening for infections with a predominantly sexual mode of transmission: Secondary | ICD-10-CM | POA: Diagnosis not present

## 2024-03-28 NOTE — ED Provider Notes (Signed)
 [facility]    CSN: [id number] Arrival date & time: 03/28/24  1733      History   Chief Complaint Chief Complaint  Patient presents with   SEXUALLY TRANSMITTED DISEASE    HPI Cassidy Brewer is a 23 y.o. female.   HPI Patient has no symptoms.  She is here for STD testing.  She just wants a swab and declines blood work History reviewed. No pertinent past medical history.  There are no active problems to display for this patient.   History reviewed. No pertinent surgical history.  OB History   No obstetric history on file.      Home Medications    Prior to Admission medications   Not on File    Family History History reviewed. No pertinent family history.  Social History Social History   Tobacco Use   Smoking status: Never   Smokeless tobacco: Never  Vaping Use   Vaping status: Every Day   Substances: Nicotine, Flavoring  Substance Use Topics   Alcohol use: Yes    Comment: occasionally   Drug use: No     Allergies   Patient has no known allergies.   Review of Systems Review of Systems  See HPI Physical Exam Triage Vital Signs ED Triage Vitals  Encounter Vitals Group     BP 03/28/24 1747 131/78     Girls Systolic BP Percentile --      Girls Diastolic BP Percentile --      Boys Systolic BP Percentile --      Boys Diastolic BP Percentile --      Pulse Rate 03/28/24 1747 77     Resp 03/28/24 1747 16     Temp 03/28/24 1747 99.2 F (37.3 C)     Temp src --      SpO2 03/28/24 1747 95 %     Weight --      Height --      Head Circumference --      Peak Flow --      Pain Score 03/28/24 1750 0     Pain Loc --      Pain Education --      Exclude from Growth Chart --    No data found.  Updated Vital Signs BP 131/78   Pulse 77   Temp 99.2 F (37.3 C)   Resp 16   LMP 03/05/2024 (Exact Date)   SpO2 95%   Physical Exam Constitutional:      General: She is not in acute distress.    Appearance: She is well-developed.   HENT:     Head: Normocephalic and atraumatic.  Eyes:     Conjunctiva/sclera: Conjunctivae normal.     Pupils: Pupils are equal, round, and reactive to light.  Cardiovascular:     Rate and Rhythm: Normal rate.  Pulmonary:     Effort: Pulmonary effort is normal. No respiratory distress.  Musculoskeletal:        General: Normal range of motion.     Cervical back: Normal range of motion.  Skin:    General: Skin is warm and dry.  Neurological:     Mental Status: She is alert.      UC Treatments / Results  Labs (all labs ordered are listed, but only abnormal results are displayed) Labs Reviewed  CERVICOVAGINAL ANCILLARY ONLY    EKG   Radiology No results found.  Procedures Procedures (including critical care time)  Medications Ordered in UC Medications - No data  to display  Initial Impression / Assessment and Plan / UC Course  I have reviewed the triage vital signs and the nursing notes.  Pertinent labs & imaging results that were available during my care of the patient were reviewed by me and considered in my medical decision making (see chart for details).     Safe sex is recommended Final Clinical Impressions(s) / UC Diagnoses   Final diagnoses:  Encounter for screening examination for sexually transmitted disease     Discharge Instructions      Check my chart for your test results   ED Prescriptions   None    PDMP not reviewed this encounter.   Maranda Jamee Jacob, MD 03/28/24 [phone]

## 2024-03-28 NOTE — ED Triage Notes (Signed)
 Wants std testing. Denies symptoms. Just wants swab today, no bloodwork.

## 2024-03-28 NOTE — Discharge Instructions (Signed)
Check mychart for your test results.

## 2024-03-30 LAB — CERVICOVAGINAL ANCILLARY ONLY
Bacterial Vaginitis (gardnerella): POSITIVE — AB
Candida Glabrata: NEGATIVE
Candida Vaginitis: POSITIVE — AB
Chlamydia: NEGATIVE
Comment: NEGATIVE
Comment: NEGATIVE
Comment: NEGATIVE
Comment: NEGATIVE
Comment: NEGATIVE
Comment: NORMAL
Neisseria Gonorrhea: NEGATIVE
Trichomonas: POSITIVE — AB

## 2024-03-31 ENCOUNTER — Ambulatory Visit: Payer: Self-pay

## 2024-03-31 MED ORDER — METRONIDAZOLE 500 MG PO TABS: 500.0000 mg | ORAL_TABLET | Freq: Two times a day (BID) | ORAL | 0 refills | Status: AC

## 2024-03-31 MED ORDER — FLUCONAZOLE 150 MG PO TABS: 150.0000 mg | ORAL_TABLET | Freq: Every day | ORAL | 0 refills | Status: DC

## 2024-04-20 ENCOUNTER — Ambulatory Visit

## 2024-05-05 ENCOUNTER — Ambulatory Visit

## 2024-05-05 ENCOUNTER — Ambulatory Visit
Admission: RE | Admit: 2024-05-05 | Discharge: 2024-05-05 | Disposition: A | Discharge status: Home / Self Care | Source: Ambulatory Visit | Attending: Family Medicine | Admitting: Family Medicine

## 2024-05-05 VITALS — BP 121/85 | HR 71 | Temp 98.5°F | Resp 16

## 2024-05-05 DIAGNOSIS — A5901 Trichomonal vulvovaginitis: Secondary | ICD-10-CM | POA: Diagnosis present

## 2024-05-05 DIAGNOSIS — N898 Other specified noninflammatory disorders of vagina: Secondary | ICD-10-CM | POA: Diagnosis not present

## 2024-05-05 NOTE — ED Triage Notes (Signed)
 Patient presents to Urgent Care with complaints of retesting STD from previous testing. Patient reports she received her results and wanted to make sure has cleared up. Her current symptoms are vaginal itching but doesn't feel as previous symptoms. Was not able to complete the full dose of Metronidazole .

## 2024-05-05 NOTE — Discharge Instructions (Signed)
 Check MyChart for your test results When nurse will call you if any results are positive

## 2024-05-05 NOTE — ED Provider Notes (Signed)
 [name]    CSN: [id number] Arrival date & time: 05/05/24  1431      History   Chief Complaint Chief Complaint  Patient presents with   SEXUALLY TRANSMITTED DISEASE    Entered by patient    HPI Cassidy Brewer is a 23 y.o. female.   Patient was seen in July of this year for STD check.  She was found to have BV yeast and trichomonas.  She was treated with Diflucan  and metronidazole .  States she did not complete the 7 days of metronidazole .  Is here today with vaginal itching.  Wishes to be retested    History reviewed. No pertinent past medical history.  There are no active problems to display for this patient.   History reviewed. No pertinent surgical history.  OB History   No obstetric history on file.      Home Medications    Prior to Admission medications   Not on File    Family History History reviewed. No pertinent family history.  Social History Social History   Tobacco Use   Smoking status: Never   Smokeless tobacco: Never  Vaping Use   Vaping status: Every Day   Substances: Nicotine, Flavoring  Substance Use Topics   Alcohol use: Yes    Comment: occasionally   Drug use: No     Allergies   Patient has no known allergies.   Review of Systems Review of Systems  See HPI Physical Exam Triage Vital Signs ED Triage Vitals  Encounter Vitals Group     BP 05/05/24 1455 121/85     Girls Systolic BP Percentile --      Girls Diastolic BP Percentile --      Boys Systolic BP Percentile --      Boys Diastolic BP Percentile --      Pulse Rate 05/05/24 1455 71     Resp 05/05/24 1455 16     Temp 05/05/24 1455 98.5 F (36.9 C)     Temp Source 05/05/24 1455 Oral     SpO2 05/05/24 1455 98 %     Weight --      Height --      Head Circumference --      Peak Flow --      Pain Score 05/05/24 1452 0     Pain Loc --      Pain Education --      Exclude from Growth Chart --    No data found.  Updated Vital Signs BP 121/85 (BP  Location: Right Arm)   Pulse 71   Temp 98.5 F (36.9 C) (Oral)   Resp 16   LMP 04/30/2024 (Exact Date)   SpO2 98%     Physical Exam Constitutional:      General: She is not in acute distress.    Appearance: She is well-developed.  HENT:     Head: Normocephalic and atraumatic.  Eyes:     Conjunctiva/sclera: Conjunctivae normal.     Pupils: Pupils are equal, round, and reactive to light.  Cardiovascular:     Rate and Rhythm: Normal rate.  Pulmonary:     Effort: Pulmonary effort is normal. No respiratory distress.  Musculoskeletal:        General: Normal range of motion.     Cervical back: Normal range of motion.  Skin:    General: Skin is warm and dry.  Neurological:     Mental Status: She is alert.      UC Treatments /  Results  Labs (all labs ordered are listed, but only abnormal results are displayed) Labs Reviewed - No data to display  EKG   Radiology No results found.  Procedures Procedures (including critical care time)  Medications Ordered in UC Medications - No data to display  Initial Impression / Assessment and Plan / UC Course  I have reviewed the triage vital signs and the nursing notes.  Pertinent labs & imaging results that were available during my care of the patient were reviewed by me and considered in my medical decision making (see chart for details).     Recommended abstinence from sexual encounters until STD evaluation is complete Final Clinical Impressions(s) / UC Diagnoses   Final diagnoses:  Vaginitis due to Trichomonas  Itching in the vaginal area     Discharge Instructions      Check MyChart for your test results When nurse will call you if any results are positive   ED Prescriptions   None    PDMP not reviewed this encounter.   Maranda Jamee Jacob, MD 05/05/24 [phone]

## 2024-05-09 ENCOUNTER — Ambulatory Visit (HOSPITAL_COMMUNITY): Payer: Self-pay

## 2024-05-09 LAB — CERVICOVAGINAL ANCILLARY ONLY
Bacterial Vaginitis (gardnerella): POSITIVE — AB
Candida Glabrata: NEGATIVE
Candida Vaginitis: NEGATIVE
Chlamydia: NEGATIVE
Comment: NEGATIVE
Comment: NEGATIVE
Comment: NEGATIVE
Comment: NEGATIVE
Comment: NEGATIVE
Comment: NORMAL
Neisseria Gonorrhea: NEGATIVE
Trichomonas: NEGATIVE

## 2024-05-09 MED ORDER — METRONIDAZOLE 500 MG PO TABS: 500.0000 mg | ORAL_TABLET | Freq: Two times a day (BID) | ORAL | 0 refills | Status: AC

## 2024-05-25 ENCOUNTER — Ambulatory Visit

## 2024-05-25 ENCOUNTER — Ambulatory Visit
Admission: EM | Admit: 2024-05-25 | Discharge: 2024-05-25 | Disposition: A | Discharge status: Home / Self Care | Attending: Family Medicine | Admitting: Family Medicine

## 2024-05-25 DIAGNOSIS — N3001 Acute cystitis with hematuria: Secondary | ICD-10-CM | POA: Diagnosis present

## 2024-05-25 DIAGNOSIS — Z202 Contact with and (suspected) exposure to infections with a predominantly sexual mode of transmission: Secondary | ICD-10-CM | POA: Insufficient documentation

## 2024-05-25 LAB — POCT URINALYSIS DIP (MANUAL ENTRY)
Bilirubin, UA: NEGATIVE
Glucose, UA: NEGATIVE mg/dL
Nitrite, UA: POSITIVE — AB
Protein Ur, POC: 30 mg/dL — AB
Spec Grav, UA: >=1.03 — AB (ref 1.010–1.025)
Urobilinogen, UA: 0.2 U/dL
pH, UA: 6 (ref 5.0–8.0)

## 2024-05-25 NOTE — ED Triage Notes (Signed)
 Pt presents to uc with co dysuria and strong smell to urine since Sunday. Pt would also like STI testing.

## 2024-05-25 NOTE — ED Provider Notes (Signed)
 [name]    CSN: [id number] Arrival date & time: 05/25/24  1657      History   Chief Complaint Chief Complaint  Patient presents with   Dysuria    HPI Rodina Pinales is a 23 y.o. female.   HPI  No past medical history on file.  There are no active problems to display for this patient.   No past surgical history on file.  OB History   No obstetric history on file.      Home Medications    Prior to Admission medications   Not on File    Family History No family history on file.  Social History Social History   Tobacco Use   Smoking status: Never   Smokeless tobacco: Never  Vaping Use   Vaping status: Every Day   Substances: Nicotine, Flavoring  Substance Use Topics   Alcohol use: Yes    Comment: occasionally   Drug use: No     Allergies   Patient has no known allergies.   Review of Systems Review of Systems  Genitourinary:  Positive for dysuria.     Physical Exam Triage Vital Signs ED Triage Vitals  Encounter Vitals Group     BP      Girls Systolic BP Percentile      Girls Diastolic BP Percentile      Boys Systolic BP Percentile      Boys Diastolic BP Percentile      Pulse      Resp      Temp      Temp src      SpO2      Weight      Height      Head Circumference      Peak Flow      Pain Score      Pain Loc      Pain Education      Exclude from Growth Chart    No data found.  Updated Vital Signs BP 127/89   Pulse 91   Temp 98.3 F (36.8 C)   Resp 19   LMP 04/30/2024 (Exact Date)   SpO2 98%    Physical Exam Vitals and nursing note reviewed.  Constitutional:      General: She is not in acute distress.    Appearance: Normal appearance. She is normal weight. She is not ill-appearing.  HENT:     Head: Normocephalic and atraumatic.     Mouth/Throat:     Mouth: Mucous membranes are moist.     Pharynx: Oropharynx is clear.  Eyes:     Extraocular Movements: Extraocular movements intact.      Conjunctiva/sclera: Conjunctivae normal.     Pupils: Pupils are equal, round, and reactive to light.  Cardiovascular:     Rate and Rhythm: Normal rate and regular rhythm.     Pulses: Normal pulses.     Heart sounds: Normal heart sounds.  Pulmonary:     Effort: Pulmonary effort is normal.     Breath sounds: Normal breath sounds. No wheezing, rhonchi or rales.  Musculoskeletal:        General: Normal range of motion.     Cervical back: Normal range of motion and neck supple.  Skin:    General: Skin is warm and dry.  Neurological:     General: No focal deficit present.     Mental Status: She is alert and oriented to person, place, and time.  Psychiatric:  Mood and Affect: Mood normal.        Behavior: Behavior normal.      UC Treatments / Results  Labs (all labs ordered are listed, but only abnormal results are displayed) Labs Reviewed  POCT URINALYSIS DIP (MANUAL ENTRY) - Abnormal; Notable for the following components:      Result Value   Clarity, UA cloudy (*)    Ketones, POC UA trace (5) (*)    Spec Grav, UA >=1.030 (*)    Blood, UA trace-intact (*)    Protein Ur, POC =30 (*)    Nitrite, UA Positive (*)    Leukocytes, UA Small (1+) (*)    All other components within normal limits  URINE CULTURE  CERVICOVAGINAL ANCILLARY ONLY    EKG   Radiology No results found.  Procedures Procedures (including critical care time)  Medications Ordered in UC Medications - No data to display  Initial Impression / Assessment and Plan / UC Course  I have reviewed the triage vital signs and the nursing notes.  Pertinent labs & imaging results that were available during my care of the patient were reviewed by me and considered in my medical decision making (see chart for details).     MDM: 1.  Acute cystitis with hematuria-UA revealed above, urine culture ordered, Rx'd cefdinir 300 mg capsule: Take 1 capsule twice daily x 7 days; 2.  Potential exposure to STD-Aptima swab  ordered. Advised patient to take medication as directed with food to completion.  Encouraged to increase daily water intake to 64 ounces per day while taking this medication and 7 days/week going forward.  Advised we will follow-up with Aptima swab and urine culture results once received.  Patient discharged home, hemodynamically stable. Final Clinical Impressions(s) / UC Diagnoses   Final diagnoses:  Acute cystitis with hematuria  Potential exposure to STD     Discharge Instructions      Advised patient to take medication as directed with food to completion.  Encouraged to increase daily water intake to 64 ounces per day while taking this medication and 7 days/week going forward.  Advised we will follow-up with Aptima swab and urine culture results once received.  Advised if symptoms worsen and/or unresolved please follow-up with your PCP or here for further evaluation.     ED Prescriptions   None    PDMP not reviewed this encounter.   Teddy Sharper, FNP 05/25/24 1750

## 2024-05-25 NOTE — Discharge Instructions (Addendum)
 Advised patient to take medication as directed with food to completion.  Encouraged to increase daily water intake to 64 ounces per day while taking this medication and 7 days/week going forward.  Advised we will follow-up with Aptima swab and urine culture results once received.  Advised if symptoms worsen and/or unresolved please follow-up with your PCP or here for further evaluation.

## 2024-05-28 LAB — URINE CULTURE: Culture: >=100000 — AB

## 2024-05-30 ENCOUNTER — Ambulatory Visit (HOSPITAL_COMMUNITY): Payer: Self-pay

## 2024-05-30 LAB — CERVICOVAGINAL ANCILLARY ONLY
Bacterial Vaginitis (gardnerella): POSITIVE — AB
Candida Glabrata: NEGATIVE
Candida Vaginitis: NEGATIVE
Chlamydia: POSITIVE — AB
Comment: NEGATIVE
Comment: NEGATIVE
Comment: NEGATIVE
Comment: NEGATIVE
Comment: NEGATIVE
Comment: NORMAL
Neisseria Gonorrhea: NEGATIVE
Trichomonas: NEGATIVE

## 2024-05-30 MED ORDER — DOXYCYCLINE HYCLATE 100 MG PO TABS: 100.0000 mg | ORAL_TABLET | Freq: Two times a day (BID) | ORAL | 0 refills | Status: AC

## 2024-05-30 MED ORDER — NITROFURANTOIN MONOHYD MACRO 100 MG PO CAPS: 100.0000 mg | ORAL_CAPSULE | Freq: Two times a day (BID) | ORAL | 0 refills | Status: AC

## 2024-05-30 MED ORDER — METRONIDAZOLE 0.75 % VA GEL: 1.0000 | Freq: Every day | VAGINAL | 0 refills | Status: AC

## 2024-08-24 ENCOUNTER — Ambulatory Visit: Payer: Self-pay
